# Patient Record
Sex: Female | Born: 1990 | Race: Black or African American | Hispanic: No | Marital: Single | Smoking: Current every day smoker
Health system: Southern US, Community
[De-identification: ages and names within clinical notes are randomized; demographics above are authoritative.]

---

## 2014-06-13 ENCOUNTER — Emergency Department (HOSPITAL_COMMUNITY): Payer: BC Managed Care – PPO

## 2014-06-13 ENCOUNTER — Encounter (HOSPITAL_COMMUNITY): Payer: Self-pay | Admitting: Emergency Medicine

## 2014-06-13 ENCOUNTER — Emergency Department (HOSPITAL_COMMUNITY)
Admission: EM | Admit: 2014-06-13 | Discharge: 2014-06-14 | Disposition: A | Discharge status: Home / Self Care | Payer: BC Managed Care – PPO | Attending: Emergency Medicine | Admitting: Emergency Medicine

## 2014-06-13 DIAGNOSIS — S0993XA Unspecified injury of face, initial encounter: Secondary | ICD-10-CM | POA: Diagnosis not present

## 2014-06-13 DIAGNOSIS — F17200 Nicotine dependence, unspecified, uncomplicated: Secondary | ICD-10-CM | POA: Diagnosis not present

## 2014-06-13 DIAGNOSIS — Y9289 Other specified places as the place of occurrence of the external cause: Secondary | ICD-10-CM | POA: Diagnosis not present

## 2014-06-13 DIAGNOSIS — S060X0A Concussion without loss of consciousness, initial encounter: Secondary | ICD-10-CM | POA: Diagnosis not present

## 2014-06-13 DIAGNOSIS — Y9389 Activity, other specified: Secondary | ICD-10-CM | POA: Diagnosis not present

## 2014-06-13 DIAGNOSIS — S199XXA Unspecified injury of neck, initial encounter: Secondary | ICD-10-CM

## 2014-06-13 DIAGNOSIS — IMO0002 Reserved for concepts with insufficient information to code with codable children: Secondary | ICD-10-CM | POA: Insufficient documentation

## 2014-06-13 DIAGNOSIS — R42 Dizziness and giddiness: Secondary | ICD-10-CM | POA: Insufficient documentation

## 2014-06-13 DIAGNOSIS — S0990XA Unspecified injury of head, initial encounter: Secondary | ICD-10-CM | POA: Diagnosis present

## 2014-06-13 MED ORDER — HYDROCODONE-ACETAMINOPHEN 5-325 MG PO TABS
2.0000 | ORAL_TABLET | Freq: Once | ORAL | Status: AC
  Administered 2014-06-13: 2 via ORAL
  Filled 2014-06-13: qty 2

## 2014-06-13 MED ORDER — ONDANSETRON 8 MG PO TBDP
8.0000 mg | ORAL_TABLET | Freq: Once | ORAL | Status: AC
  Administered 2014-06-13: 8 mg via ORAL
  Filled 2014-06-13: qty 1
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
## 2014-06-13 NOTE — ED Notes (Signed)
Patient transported to CT 
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
## 2014-06-13 NOTE — Discharge Instructions (Signed)
Concussion  A concussion, or closed-head injury, is a brain injury caused by a direct blow to the head or by a quick and sudden movement (jolt) of the head or neck. Concussions are usually not life-threatening. Even so, the effects of a concussion can be serious. If you have had a concussion before, you are more likely to experience concussion-like symptoms after a direct blow to the head.   CAUSES  · Direct blow to the head, such as from running into another player during a soccer game, being hit in a fight, or hitting your head on a hard surface.  · A jolt of the head or neck that causes the brain to move back and forth inside the skull, such as in a car crash.  SIGNS AND SYMPTOMS  The signs of a concussion can be hard to notice. Early on, they may be missed by you, family members, and health care providers. You may look fine but act or feel differently.  Symptoms are usually temporary, but they may last for days, weeks, or even longer. Some symptoms may appear right away while others may not show up for hours or days. Every head injury is different. Symptoms include:  · Mild to moderate headaches that will not go away.  · A feeling of pressure inside your head.  · Having more trouble than usual:  ¨ Learning or remembering things you have heard.  ¨ Answering questions.  ¨ Paying attention or concentrating.  ¨ Organizing daily tasks.  ¨ Making decisions and solving problems.  · Slowness in thinking, acting or reacting, speaking, or reading.  · Getting lost or being easily confused.  · Feeling tired all the time or lacking energy (fatigued).  · Feeling drowsy.  · Sleep disturbances.  ¨ Sleeping more than usual.  ¨ Sleeping less than usual.  ¨ Trouble falling asleep.  ¨ Trouble sleeping (insomnia).  · Loss of balance or feeling lightheaded or dizzy.  · Nausea or vomiting.  · Numbness or tingling.  · Increased sensitivity to:  ¨ Sounds.  ¨ Lights.  ¨ Distractions.  · Vision problems or eyes that tire  easily.  · Diminished sense of taste or smell.  · Ringing in the ears.  · Mood changes such as feeling sad or anxious.  · Becoming easily irritated or angry for little or no reason.  · Lack of motivation.  · Seeing or hearing things other people do not see or hear (hallucinations).  DIAGNOSIS  Your health care provider can usually diagnose a concussion based on a description of your injury and symptoms. He or she will ask whether you passed out (lost consciousness) and whether you are having trouble remembering events that happened right before and during your injury.  Your evaluation might include:  · A brain scan to look for signs of injury to the brain. Even if the test shows no injury, you may still have a concussion.  · Blood tests to be sure other problems are not present.  TREATMENT  · Concussions are usually treated in an emergency department, in urgent care, or at a clinic. You may need to stay in the hospital overnight for further treatment.  · Tell your health care provider if you are taking any medicines, including prescription medicines, over-the-counter medicines, and natural remedies. Some medicines, such as blood thinners (anticoagulants) and aspirin, may increase the chance of complications. Also tell your health care provider whether you have had alcohol or are taking illegal drugs. This information   may affect treatment.  · Your health care provider will send you home with important instructions to follow.  · How fast you will recover from a concussion depends on many factors. These factors include how severe your concussion is, what part of your brain was injured, your age, and how healthy you were before the concussion.  · Most people with mild injuries recover fully. Recovery can take time. In general, recovery is slower in older persons. Also, persons who have had a concussion in the past or have other medical problems may find that it takes longer to recover from their current injury.  HOME  CARE INSTRUCTIONS  General Instructions  · Carefully follow the directions your health care provider gave you.  · Only take over-the-counter or prescription medicines for pain, discomfort, or fever as directed by your health care provider.  · Take only those medicines that your health care provider has approved.  · Do not drink alcohol until your health care provider says you are well enough to do so. Alcohol and certain other drugs may slow your recovery and can put you at risk of further injury.  · If it is harder than usual to remember things, write them down.  · If you are easily distracted, try to do one thing at a time. For example, do not try to watch TV while fixing dinner.  · Talk with family members or close friends when making important decisions.  · Keep all follow-up appointments. Repeated evaluation of your symptoms is recommended for your recovery.  · Watch your symptoms and tell others to do the same. Complications sometimes occur after a concussion. Older adults with a brain injury may have a higher risk of serious complications, such as a blood clot on the brain.  · Tell your teachers, school nurse, school counselor, coach, athletic trainer, or work manager about your injury, symptoms, and restrictions. Tell them about what you can or cannot do. They should watch for:  ¨ Increased problems with attention or concentration.  ¨ Increased difficulty remembering or learning new information.  ¨ Increased time needed to complete tasks or assignments.  ¨ Increased irritability or decreased ability to cope with stress.  ¨ Increased symptoms.  · Rest. Rest helps the brain to heal. Make sure you:  ¨ Get plenty of sleep at night. Avoid staying up late at night.  ¨ Keep the same bedtime hours on weekends and weekdays.  ¨ Rest during the day. Take daytime naps or rest breaks when you feel tired.  · Limit activities that require a lot of thought or concentration. These include:  ¨ Doing homework or job-related  work.  ¨ Watching TV.  ¨ Working on the computer.  · Avoid any situation where there is potential for another head injury (football, hockey, soccer, basketball, martial arts, downhill snow sports and horseback riding). Your condition will get worse every time you experience a concussion. You should avoid these activities until you are evaluated by the appropriate follow-up health care providers.  Returning To Your Regular Activities  You will need to return to your normal activities slowly, not all at once. You must give your body and brain enough time for recovery.  · Do not return to sports or other athletic activities until your health care provider tells you it is safe to do so.  · Ask your health care provider when you can drive, ride a bicycle, or operate heavy machinery. Your ability to react may be slower after a   brain injury. Never do these activities if you are dizzy.  · Ask your health care provider about when you can return to work or school.  Preventing Another Concussion  It is very important to avoid another brain injury, especially before you have recovered. In rare cases, another injury can lead to permanent brain damage, brain swelling, or death. The risk of this is greatest during the first 7-10 days after a head injury. Avoid injuries by:  · Wearing a seat belt when riding in a car.  · Drinking alcohol only in moderation.  · Wearing a helmet when biking, skiing, skateboarding, skating, or doing similar activities.  · Avoiding activities that could lead to a second concussion, such as contact or recreational sports, until your health care provider says it is okay.  · Taking safety measures in your home.  ¨ Remove clutter and tripping hazards from floors and stairways.  ¨ Use grab bars in bathrooms and handrails by stairs.  ¨ Place non-slip mats on floors and in bathtubs.  ¨ Improve lighting in dim areas.  SEEK MEDICAL CARE IF:  · You have increased problems paying attention or  concentrating.  · You have increased difficulty remembering or learning new information.  · You need more time to complete tasks or assignments than before.  · You have increased irritability or decreased ability to cope with stress.  · You have more symptoms than before.  Seek medical care if you have any of the following symptoms for more than 2 weeks after your injury:  · Lasting (chronic) headaches.  · Dizziness or balance problems.  · Nausea.  · Vision problems.  · Increased sensitivity to noise or light.  · Depression or mood swings.  · Anxiety or irritability.  · Memory problems.  · Difficulty concentrating or paying attention.  · Sleep problems.  · Feeling tired all the time.  SEEK IMMEDIATE MEDICAL CARE IF:  · You have severe or worsening headaches. These may be a sign of a blood clot in the brain.  · You have weakness (even if only in one hand, leg, or part of the face).  · You have numbness.  · You have decreased coordination.  · You vomit repeatedly.  · You have increased sleepiness.  · One pupil is larger than the other.  · You have convulsions.  · You have slurred speech.  · You have increased confusion. This may be a sign of a blood clot in the brain.  · You have increased restlessness, agitation, or irritability.  · You are unable to recognize people or places.  · You have neck pain.  · It is difficult to wake you up.  · You have unusual behavior changes.  · You lose consciousness.  MAKE SURE YOU:  · Understand these instructions.  · Will watch your condition.  · Will get help right away if you are not doing well or get worse.  Document Released: 12/19/2003 Document Revised: 10/03/2013 Document Reviewed: 04/20/2013  [employer]® Patient Information ©2015 [employer]. This information is not intended to replace advice given to you by your health care provider. Make sure you discuss any questions you have with your health care provider.

## 2014-06-14 MED ORDER — ONDANSETRON HCL 4 MG PO TABS: 4.0000 mg | ORAL_TABLET | Freq: Four times a day (QID) | ORAL | Status: DC

## 2014-06-14 MED ORDER — HYDROCODONE-ACETAMINOPHEN 5-325 MG PO TABS
1.0000 | ORAL_TABLET | Freq: Four times a day (QID) | ORAL | Status: DC | PRN
Start: 2014-06-14 — End: 2014-08-07

## 2014-06-14 NOTE — ED Provider Notes (Signed)
Medical screening examination/treatment/procedure(s) were performed by non-physician practitioner and as supervising physician I was immediately available for consultation/collaboration.   EKG Interpretation None       Iesha Summerhill M Devyn Griffing, MD 06/14/14 0658 

## 2014-08-07 ENCOUNTER — Emergency Department (HOSPITAL_COMMUNITY)
Admission: EM | Admit: 2014-08-07 | Discharge: 2014-08-08 | Disposition: A | Discharge status: Home / Self Care | Payer: BC Managed Care – PPO | Attending: Emergency Medicine | Admitting: Emergency Medicine

## 2014-08-07 ENCOUNTER — Emergency Department (HOSPITAL_COMMUNITY): Payer: BC Managed Care – PPO

## 2014-08-07 ENCOUNTER — Encounter (HOSPITAL_COMMUNITY): Payer: Self-pay | Admitting: Emergency Medicine

## 2014-08-07 DIAGNOSIS — Z720 Tobacco use: Secondary | ICD-10-CM | POA: Insufficient documentation

## 2014-08-07 DIAGNOSIS — R51 Headache: Secondary | ICD-10-CM | POA: Diagnosis present

## 2014-08-07 DIAGNOSIS — Z3202 Encounter for pregnancy test, result negative: Secondary | ICD-10-CM | POA: Diagnosis not present

## 2014-08-07 DIAGNOSIS — J111 Influenza due to unidentified influenza virus with other respiratory manifestations: Secondary | ICD-10-CM

## 2014-08-07 DIAGNOSIS — R05 Cough: Secondary | ICD-10-CM

## 2014-08-07 DIAGNOSIS — R059 Cough, unspecified: Secondary | ICD-10-CM

## 2014-08-07 DIAGNOSIS — R69 Illness, unspecified: Secondary | ICD-10-CM

## 2014-08-07 DIAGNOSIS — R509 Fever, unspecified: Secondary | ICD-10-CM

## 2014-08-07 LAB — URINALYSIS, ROUTINE W REFLEX MICROSCOPIC
Bilirubin Urine: NEGATIVE
Glucose, UA: NEGATIVE mg/dL
Hgb urine dipstick: NEGATIVE
Ketones, ur: 40 mg/dL — AB
Nitrite: NEGATIVE
Protein, ur: 30 mg/dL — AB
Specific Gravity, Urine: 1.03 (ref 1.005–1.030)
Urobilinogen, UA: 0.2 mg/dL (ref 0.0–1.0)
pH: 5 (ref 5.0–8.0)

## 2014-08-07 LAB — BASIC METABOLIC PANEL
Anion gap: 15 (ref 5–15)
BUN: 9 mg/dL (ref 6–23)
CO2: 23 mEq/L (ref 19–32)
Calcium: 9.5 mg/dL (ref 8.4–10.5)
Chloride: 100 mEq/L (ref 96–112)
Creatinine, Ser: 1.02 mg/dL (ref 0.50–1.10)
GFR calc Af Amer: >90 mL/min (ref >90)
GFR calc non Af Amer: 78 mL/min — ABNORMAL LOW (ref >90)
Glucose, Bld: 94 mg/dL (ref 70–99)
Potassium: 3.8 mEq/L (ref 3.7–5.3)
Sodium: 138 mEq/L (ref 137–147)

## 2014-08-07 LAB — CBC WITH DIFFERENTIAL/PLATELET
Basophils Absolute: 0 10*3/uL (ref 0.0–0.1)
Basophils Relative: 0 % (ref 0–1)
Eosinophils Absolute: 0 10*3/uL (ref 0.0–0.7)
Eosinophils Relative: 0 % (ref 0–5)
HCT: 38.6 % (ref 36.0–46.0)
Hemoglobin: 12.8 g/dL (ref 12.0–15.0)
Lymphocytes Relative: 19 % (ref 12–46)
Lymphs Abs: 2.9 10*3/uL (ref 0.7–4.0)
MCH: 29.3 pg (ref 26.0–34.0)
MCHC: 33.2 g/dL (ref 30.0–36.0)
MCV: 88.3 fL (ref 78.0–100.0)
Monocytes Absolute: 1.1 10*3/uL — ABNORMAL HIGH (ref 0.1–1.0)
Monocytes Relative: 7 % (ref 3–12)
Neutro Abs: 11.6 10*3/uL — ABNORMAL HIGH (ref 1.7–7.7)
Neutrophils Relative %: 74 % (ref 43–77)
Platelets: 356 10*3/uL (ref 150–400)
RBC: 4.37 MIL/uL (ref 3.87–5.11)
RDW: 13.5 % (ref 11.5–15.5)
WBC: 15.8 10*3/uL — ABNORMAL HIGH (ref 4.0–10.5)

## 2014-08-07 LAB — PREGNANCY, URINE: Preg Test, Ur: NEGATIVE

## 2014-08-07 LAB — URINE MICROSCOPIC-ADD ON

## 2014-08-07 LAB — RAPID STREP SCREEN (MED CTR MEBANE ONLY): Streptococcus, Group A Screen (Direct): NEGATIVE

## 2014-08-07 MED ORDER — SODIUM CHLORIDE 0.9 % IV BOLUS (SEPSIS)
2000.0000 mL | Freq: Once | INTRAVENOUS | Status: AC
  Administered 2014-08-07: 2000 mL via INTRAVENOUS

## 2014-08-07 MED ORDER — KETOROLAC TROMETHAMINE 30 MG/ML IJ SOLN
30.0000 mg | Freq: Once | INTRAMUSCULAR | Status: AC
  Administered 2014-08-07: 30 mg via INTRAVENOUS
  Filled 2014-08-07: qty 1

## 2014-08-07 NOTE — ED Notes (Signed)
Pt states headache with nausea/vomiting since Thursday.  Generalized body aches.

## 2014-08-07 NOTE — ED Provider Notes (Signed)
CSN: [id number]     Arrival date & time 08/07/14  1629 History   First MD Initiated Contact with Patient 08/07/14 2000     Chief Complaint  Patient presents with  . Headache  . Emesis     (Consider location/radiation/quality/duration/timing/severity/associated sxs/prior Treatment) HPI Patient presents to the emergency department with headache, nausea, vomiting, sore throat, cough, runny nose, body aches for the last 4 days.  Patient states that she felt like she may be getting better on Sunday, but continued to have worsening symptoms.  The patient states that she tried NyQuil and Tylenol without significant relief of her symptoms.  Patient denies chest pain, shortness of breath, blurred vision, dizziness, lightheadedness, anorexia, photophobia, dysuria, near syncope or syncope.  The patient states that shenotes that movement makes her body aches, worse, nothing seems to make her condition better History reviewed. No pertinent past medical history. History reviewed. No pertinent past surgical history. History reviewed. No pertinent family history. History  Substance Use Topics  . Smoking status: Current Every Day Smoker -- 0.01 packs/day    Types: Cigarettes  . Smokeless tobacco: Not on file  . Alcohol Use: Yes     Comment: socially   OB History   Grav Para Term Preterm Abortions TAB SAB Ect Mult Living                 Review of Systems  All other systems negative except as documented in the HPI. All pertinent positives and negatives as reviewed in the HPI.   Allergies  Review of patient's allergies indicates no known allergies.  Home Medications   Prior to Admission medications   Medication Sig Start Date End Date Taking? Authorizing Provider  acetaminophen (TYLENOL) 500 MG tablet Take 1,000 mg by mouth daily as needed for fever (fever & aching).    Yes Historical Provider, MD  HYDROcodone-acetaminophen (NORCO/VICODIN) 5-325 MG per tablet Take 1 tablet by mouth every 6  (six) hours as needed for moderate pain (pain).   Yes Historical Provider, MD  Pseudoeph-Doxylamine-DM-APAP (NYQUIL PO) Take 2 tablets by mouth daily as needed (fever).   Yes Historical Provider, MD  pseudoephedrine-acetaminophen (TYLENOL SINUS) 30-500 MG TABS Take 1 tablet by mouth every 4 (four) hours as needed (fever).   Yes Historical Provider, MD   BP 127/72  Pulse 92  Temp(Src) 100.3 F (37.9 C) (Oral)  Resp 18  SpO2 100%  LMP 07/31/2014 Physical Exam  Nursing note and vitals reviewed. Constitutional: She is oriented to person, place, and time. She appears well-developed and well-nourished. No distress.  HENT:  Head: Normocephalic and atraumatic.  Mouth/Throat: Oropharynx is clear and moist.  Eyes: Pupils are equal, round, and reactive to light.  Neck: Trachea normal and normal range of motion. Neck supple. Muscular tenderness present. No spinous process tenderness present. No rigidity. No edema, no erythema and normal range of motion present.  Cardiovascular: Normal rate, regular rhythm and normal heart sounds.  Exam reveals no gallop and no friction rub.   No murmur heard. Pulmonary/Chest: Effort normal and breath sounds normal. No respiratory distress.  Abdominal: Soft. Bowel sounds are normal. She exhibits no distension. There is no tenderness.  Musculoskeletal: She exhibits no edema.  Neurological: She is alert and oriented to person, place, and time. She exhibits normal muscle tone. Coordination normal.  Skin: Skin is warm and dry. No rash noted. No erythema.    ED Course  Procedures (including critical care time) Labs Review Labs Reviewed  CBC WITH DIFFERENTIAL -  Abnormal; Notable for the following:    WBC 15.8 (*)    Neutro Abs 11.6 (*)    Monocytes Absolute 1.1 (*)    All other components within normal limits  URINALYSIS, ROUTINE W REFLEX MICROSCOPIC - Abnormal; Notable for the following:    Color, Urine AMBER (*)    APPearance TURBID (*)    Ketones, ur 40 (*)     Protein, ur 30 (*)    Leukocytes, UA MODERATE (*)    All other components within normal limits  URINE MICROSCOPIC-ADD ON - Abnormal; Notable for the following:    Squamous Epithelial / LPF MANY (*)    Bacteria, UA MANY (*)    All other components within normal limits  RAPID STREP SCREEN  URINE CULTURE  PREGNANCY, URINE  BASIC METABOLIC PANEL    Imaging Review Dg Chest 2 View  08/07/2014   CLINICAL DATA:  Fever and cough.  EXAM: CHEST  2 VIEW  COMPARISON:  None.  FINDINGS: Normal heart size and mediastinal contours. No acute infiltrate or edema. No effusion or pneumothorax. No acute osseous findings.  IMPRESSION: Negative for pneumonia   Electronically Signed   By: Tiburcio PeaJonathan  Watts M.D.   On: 08/07/2014 20:56    Patient will be treated for a flulike illness.  She is advised to return here as needed.  Told to follow up with her primary care doctor. advised her to rest, increase her fluid intake.  Patient has no nuchal rigidity and will wait for urine culture before considering treatment for her urine  MDM   Final diagnoses:  Fever  Cough         Carlyle Dollyhristopher W Shavontae Gibeault, PA-C 08/08/14 0008

## 2014-08-08 MED ORDER — IBUPROFEN 800 MG PO TABS: 800.0000 mg | ORAL_TABLET | Freq: Three times a day (TID) | ORAL | Status: AC | PRN

## 2014-08-08 MED ORDER — PROMETHAZINE-DM 6.25-15 MG/5ML PO SYRP: 5.0000 mL | ORAL_SOLUTION | Freq: Four times a day (QID) | ORAL | Status: AC | PRN

## 2014-08-08 MED ORDER — GUAIFENESIN ER 1200 MG PO TB12: 1.0000 | ORAL_TABLET | Freq: Two times a day (BID) | ORAL | Status: AC

## 2014-08-08 NOTE — ED Provider Notes (Signed)
  Medical screening examination/treatment/procedure(s) were performed by non-physician practitioner and as supervising physician I was immediately available for consultation/collaboration.   EKG Interpretation None         Gerhard Munchobert Bahja Bence, MD 08/08/14 [phone]

## 2014-08-08 NOTE — Discharge Instructions (Signed)
Return here as needed.  Rest as much as possible, increase your fluid intake.  Your chest x-ray did not show any signs of pneumonia and your strep test was negative.  This is most likely a flulike illness

## 2014-08-09 LAB — URINE CULTURE: Colony Count: >=100000

## 2014-08-09 LAB — CULTURE, GROUP A STREP

## 2015-01-07 ENCOUNTER — Emergency Department (HOSPITAL_COMMUNITY): Payer: BC Managed Care – PPO

## 2015-01-07 ENCOUNTER — Emergency Department (HOSPITAL_COMMUNITY)
Admission: EM | Admit: 2015-01-07 | Discharge: 2015-01-07 | Disposition: A | Discharge status: Home / Self Care | Payer: BC Managed Care – PPO | Attending: Emergency Medicine | Admitting: Emergency Medicine

## 2015-01-07 ENCOUNTER — Encounter (HOSPITAL_COMMUNITY): Payer: Self-pay | Admitting: Neurology

## 2015-01-07 DIAGNOSIS — S299XXA Unspecified injury of thorax, initial encounter: Secondary | ICD-10-CM | POA: Diagnosis not present

## 2015-01-07 DIAGNOSIS — Y9389 Activity, other specified: Secondary | ICD-10-CM | POA: Diagnosis not present

## 2015-01-07 DIAGNOSIS — Z3202 Encounter for pregnancy test, result negative: Secondary | ICD-10-CM | POA: Diagnosis not present

## 2015-01-07 DIAGNOSIS — S3991XA Unspecified injury of abdomen, initial encounter: Secondary | ICD-10-CM | POA: Insufficient documentation

## 2015-01-07 DIAGNOSIS — S76011A Strain of muscle, fascia and tendon of right hip, initial encounter: Secondary | ICD-10-CM

## 2015-01-07 DIAGNOSIS — Z720 Tobacco use: Secondary | ICD-10-CM | POA: Insufficient documentation

## 2015-01-07 DIAGNOSIS — S3992XA Unspecified injury of lower back, initial encounter: Secondary | ICD-10-CM | POA: Diagnosis not present

## 2015-01-07 DIAGNOSIS — Z79899 Other long term (current) drug therapy: Secondary | ICD-10-CM | POA: Insufficient documentation

## 2015-01-07 DIAGNOSIS — Y92410 Unspecified street and highway as the place of occurrence of the external cause: Secondary | ICD-10-CM | POA: Diagnosis not present

## 2015-01-07 DIAGNOSIS — Y998 Other external cause status: Secondary | ICD-10-CM | POA: Diagnosis not present

## 2015-01-07 DIAGNOSIS — S79911A Unspecified injury of right hip, initial encounter: Secondary | ICD-10-CM | POA: Diagnosis present

## 2015-01-07 DIAGNOSIS — R52 Pain, unspecified: Secondary | ICD-10-CM

## 2015-01-07 DIAGNOSIS — S76019A Strain of muscle, fascia and tendon of unspecified hip, initial encounter: Secondary | ICD-10-CM | POA: Insufficient documentation

## 2015-01-07 LAB — URINALYSIS, ROUTINE W REFLEX MICROSCOPIC
BILIRUBIN URINE: NEGATIVE
GLUCOSE, UA: NEGATIVE mg/dL
HGB URINE DIPSTICK: NEGATIVE
Ketones, ur: NEGATIVE mg/dL
Nitrite: NEGATIVE
Protein, ur: NEGATIVE mg/dL
SPECIFIC GRAVITY, URINE: 1.016 (ref 1.005–1.030)
UROBILINOGEN UA: 0.2 mg/dL (ref 0.0–1.0)
pH: 7 (ref 5.0–8.0)

## 2015-01-07 LAB — I-STAT CHEM 8, ED
BUN: 12 mg/dL (ref 6–23)
Calcium, Ion: 1.23 mmol/L (ref 1.12–1.23)
Chloride: 102 mmol/L (ref 96–112)
Creatinine, Ser: 1 mg/dL (ref 0.50–1.10)
Glucose, Bld: 86 mg/dL (ref 70–99)
HCT: 39 % (ref 36.0–46.0)
Hemoglobin: 13.3 g/dL (ref 12.0–15.0)
Potassium: 4.1 mmol/L (ref 3.5–5.1)
SODIUM: 140 mmol/L (ref 135–145)
TCO2: 23 mmol/L (ref 0–100)

## 2015-01-07 LAB — URINE MICROSCOPIC-ADD ON

## 2015-01-07 LAB — POC URINE PREG, ED: PREG TEST UR: NEGATIVE

## 2015-01-07 MED ORDER — OXYCODONE-ACETAMINOPHEN 5-325 MG PO TABS
1.0000 | ORAL_TABLET | Freq: Once | ORAL | Status: AC
  Administered 2015-01-07: 1 via ORAL
  Filled 2015-01-07: qty 1

## 2015-01-07 MED ORDER — OXYCODONE-ACETAMINOPHEN 5-325 MG PO TABS: 1.0000 | ORAL_TABLET | ORAL | Status: AC | PRN

## 2015-01-07 MED ORDER — CYCLOBENZAPRINE HCL 10 MG PO TABS: 10.0000 mg | ORAL_TABLET | Freq: Three times a day (TID) | ORAL | Status: DC | PRN

## 2015-01-07 NOTE — ED Notes (Signed)
Pt remains in xray.
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
## 2015-01-07 NOTE — ED Notes (Signed)
Patient transported to X-ray without
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
## 2015-04-29 ENCOUNTER — Emergency Department (HOSPITAL_COMMUNITY)
Admission: EM | Admit: 2015-04-29 | Discharge: 2015-04-29 | Disposition: A | Discharge status: Home / Self Care | Payer: BC Managed Care – PPO | Attending: Emergency Medicine | Admitting: Emergency Medicine

## 2015-04-29 ENCOUNTER — Emergency Department (HOSPITAL_COMMUNITY): Payer: BC Managed Care – PPO

## 2015-04-29 ENCOUNTER — Encounter (HOSPITAL_COMMUNITY): Payer: Self-pay | Admitting: Emergency Medicine

## 2015-04-29 DIAGNOSIS — Z8781 Personal history of (healed) traumatic fracture: Secondary | ICD-10-CM | POA: Insufficient documentation

## 2015-04-29 DIAGNOSIS — Z720 Tobacco use: Secondary | ICD-10-CM | POA: Insufficient documentation

## 2015-04-29 DIAGNOSIS — Z3202 Encounter for pregnancy test, result negative: Secondary | ICD-10-CM | POA: Diagnosis not present

## 2015-04-29 DIAGNOSIS — M25551 Pain in right hip: Secondary | ICD-10-CM | POA: Insufficient documentation

## 2015-04-29 LAB — POC URINE PREG, ED: Preg Test, Ur: NEGATIVE

## 2015-04-29 MED ORDER — NAPROXEN 500 MG PO TABS: 500.0000 mg | ORAL_TABLET | Freq: Two times a day (BID) | ORAL | Status: AC

## 2015-04-29 MED ORDER — CYCLOBENZAPRINE HCL 10 MG PO TABS: 5.0000 mg | ORAL_TABLET | Freq: Two times a day (BID) | ORAL | Status: DC | PRN

## 2015-04-29 MED ORDER — TRAMADOL HCL 50 MG PO TABS: 50.0000 mg | ORAL_TABLET | Freq: Four times a day (QID) | ORAL | Status: AC | PRN

## 2015-04-29 NOTE — Discharge Instructions (Signed)
Hip Pain Your hip is the joint between your upper legs and your lower pelvis. The bones, cartilage, tendons, and muscles of your hip joint perform a lot of work each day supporting your body weight and allowing you to move around. Hip pain can range from a minor ache to severe pain in one or both of your hips. Pain may be felt on the inside of the hip joint near the groin, or the outside near the buttocks and upper thigh. You may have swelling or stiffness as well.  HOME CARE INSTRUCTIONS   Take medicines only as directed by your health care provider.  Apply ice to the injured area:  Put ice in a plastic bag.  Place a towel between your skin and the bag.  Leave the ice on for 15-20 minutes at a time, 3-4 times a day.  Keep your leg raised (elevated) when possible to lessen swelling.  Avoid activities that cause pain.  Follow specific exercises as directed by your health care provider.  Sleep with a pillow between your legs on your most comfortable side.  Record how often you have hip pain, the location of the pain, and what it feels like. SEEK MEDICAL CARE IF:   You are unable to put weight on your leg.  Your hip is red or swollen or very tender to touch.  Your pain or swelling continues or worsens after 1 week.  You have increasing difficulty walking.  You have a fever. SEEK IMMEDIATE MEDICAL CARE IF:   You have fallen.  You have a sudden increase in pain and swelling in your hip. MAKE SURE YOU:   Understand these instructions.  Will watch your condition.  Will get help right away if you are not doing well or get worse. Document Released: 03/18/2010 Document Revised: 02/12/2014 Document Reviewed: 05/25/2013 [employer] Patient Information 2015 [employer]. This information is not intended to replace advice given to you by your health care provider. Make sure you discuss any questions you have with your health care provider.  
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
## 2016-03-26 IMAGING — CT CT HEAD W/O CM
4 series · 16 of 30 positions shown, 18 images · non-contrast
Comparison: None.

CLINICAL DATA: severe Headache severe Headache; HEAD INJURY HEAD
INJURY

EXAM:
CT HEAD WITHOUT CONTRAST
CT CERVICAL SPINE WITHOUT CONTRAST
TECHNIQUE: Multidetector CT imaging of the head and cervical spine was
performed following the standard protocol without intravenous
contrast. Multiplanar CT image reconstructions of the cervical spine
were also generated.

[Series 3: head w/o · axial · non-contrast · 0.44mm/px · z∈[-129,-79]mm · 2 of 30 slices shown]
[im 10/30  brain]
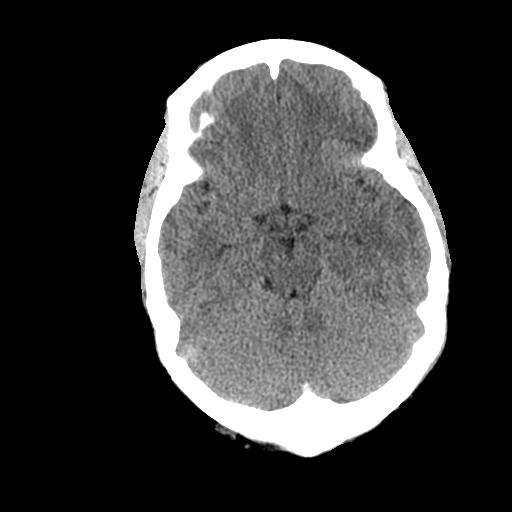
[im 20/30  brain]
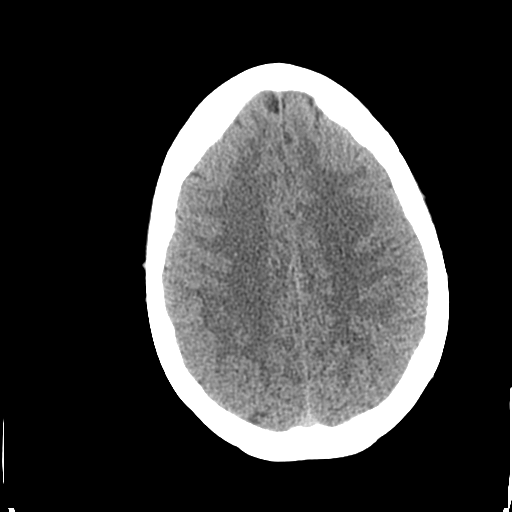

[Series 4: bone windows · axial · 0.44mm/px · z∈[-147,-60]mm · 4 of 49 slices shown]
[im 10/49  bone]
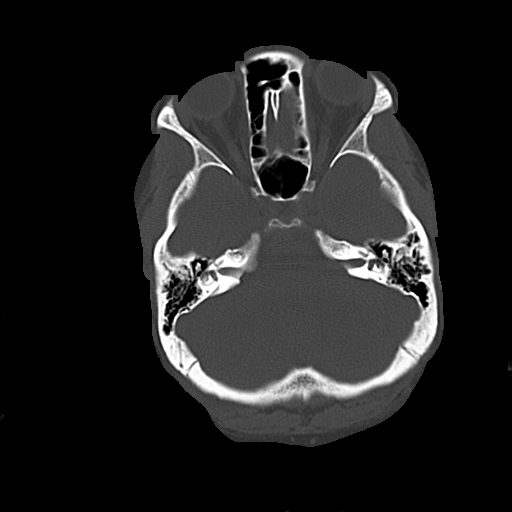
[im 20/49  bone]
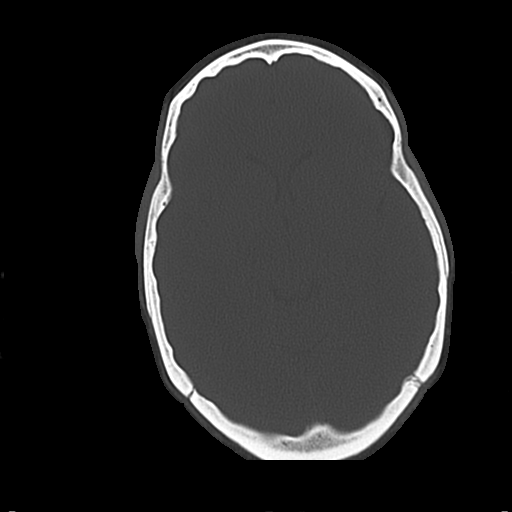
[im 29/49  bone]
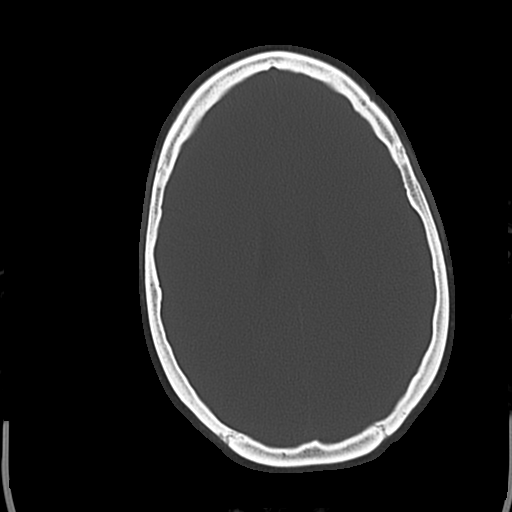
[im 39/49  bone]
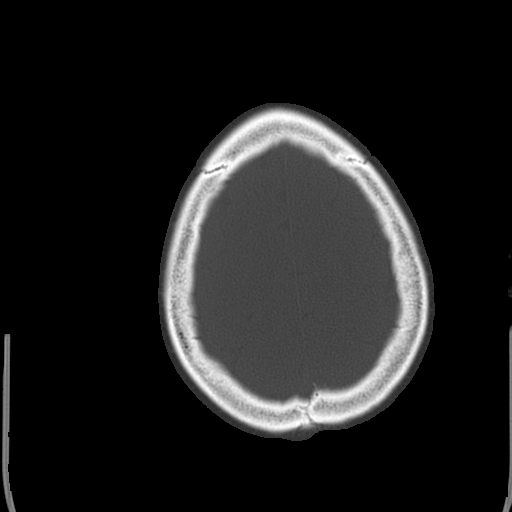

[Series 5: c-spine st · axial · 0.24mm/px · z∈[-294,-274]mm · 2 of 89 slices shown]
[im 10/89  brain]
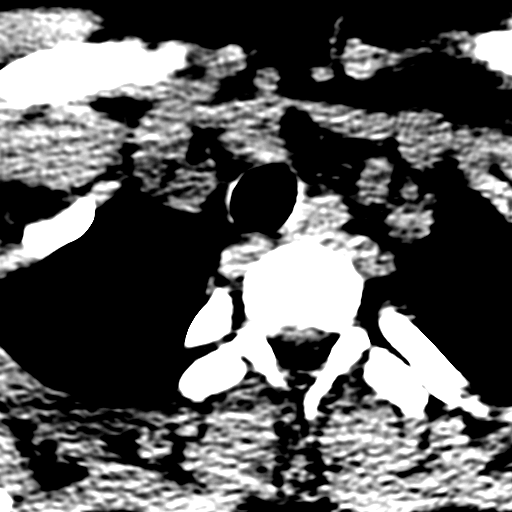
[im 20/89  brain]
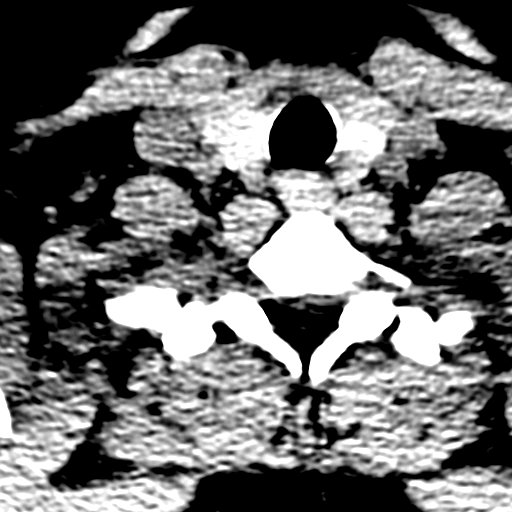

[Series 8: axial reformats · axial · 0.22mm/px · z∈[-300,-169]mm · 8 of 89 slices shown, 10 images]
[im 10/89  brain]
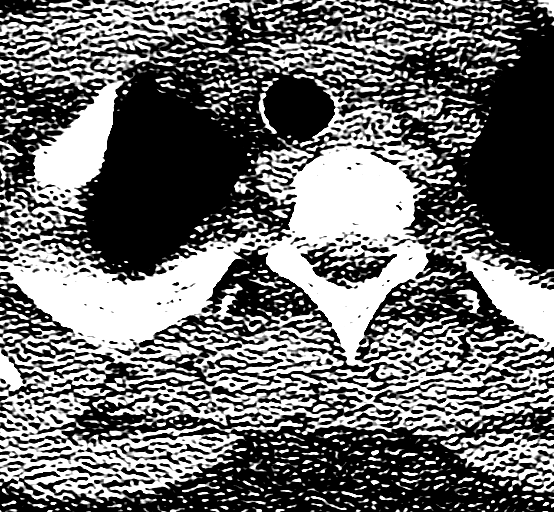
[im 10/89  bone]
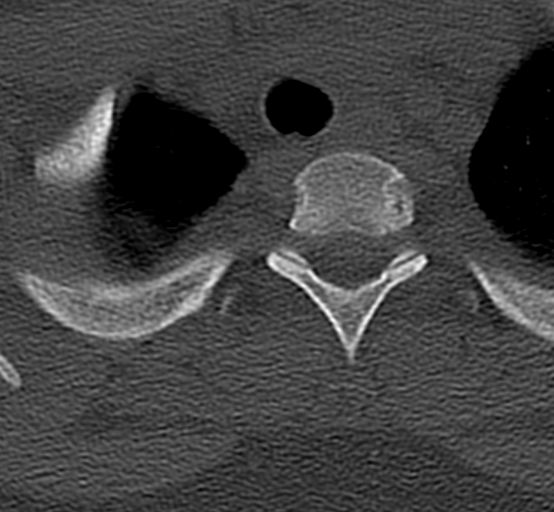
[im 20/89  brain]
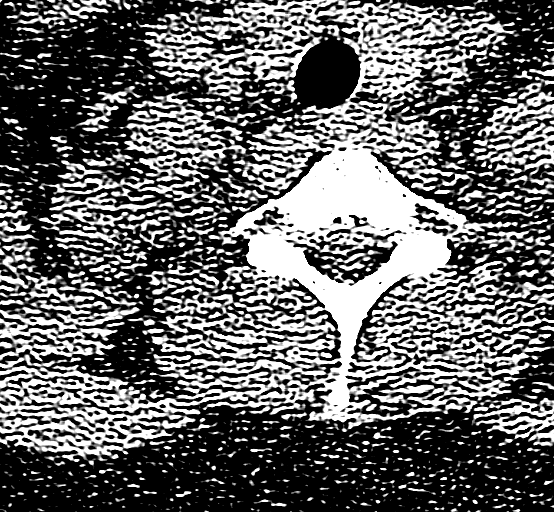
[im 30/89  brain]
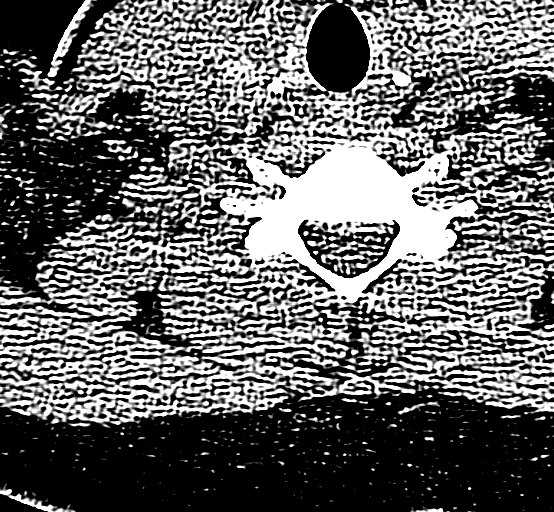
[im 40/89  brain]
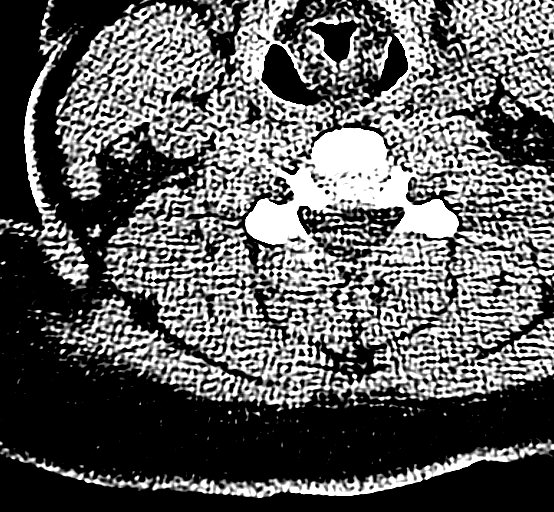
[im 49/89  brain]
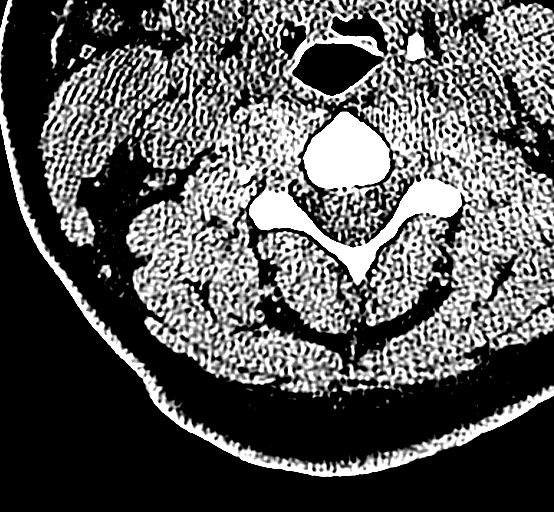
[im 49/89  bone]
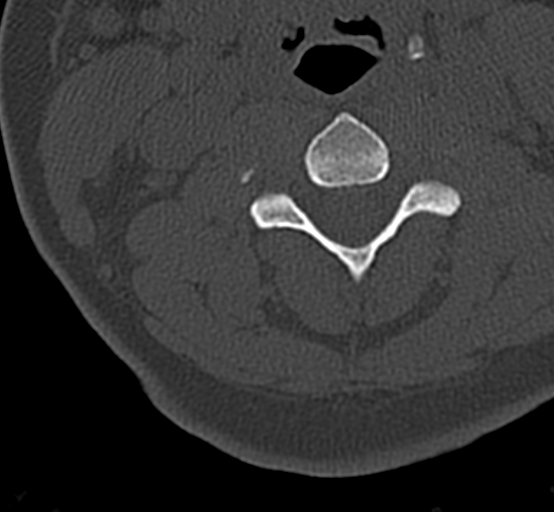
[im 59/89  brain]
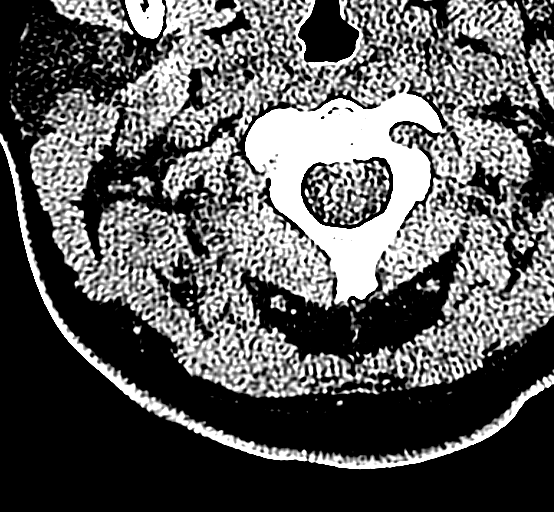
[im 69/89  brain]
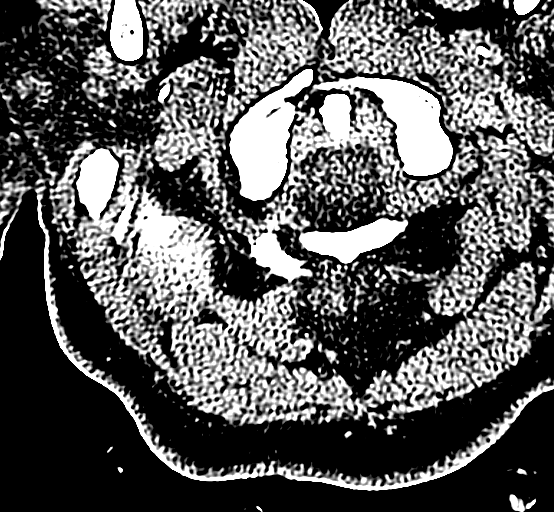
[im 79/89  brain]
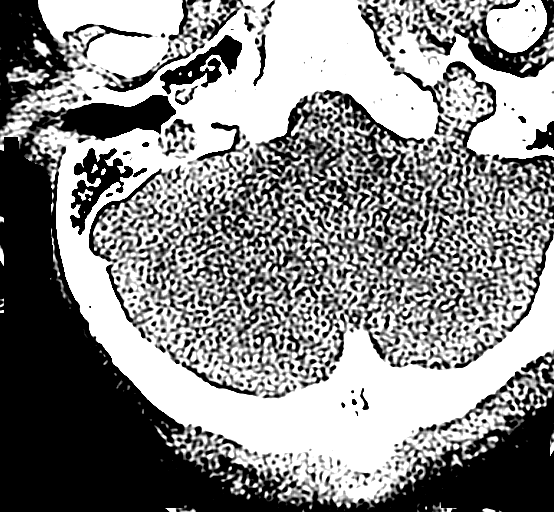

[16 of 30 positions shown; findings below may reference images not displayed]

FINDINGS: CT HEAD FINDINGS

There is no evidence of acute intracranial hemorrhage, brain edema,
mass lesion, acute infarction, mass effect, or midline shift. Acute
infarct may be inapparent on noncontrast CT. No other intra-axial
abnormalities are seen, and the ventricles and sulci are within
normal limits in size and symmetry. No abnormal extra-axial fluid
collections or masses are identified. No significant calvarial
abnormality.

CT CERVICAL SPINE FINDINGS

Reversal of the normal cervical lordosis. Vertebral body and disc
height maintained throughout. No prevertebral soft tissue swelling.
Facets are seated. Negative for fracture. No significant osseous
degenerative change. Visualized lung apices clear.
IMPRESSION: 1. Negative for bleed or other acute intracranial process.
2. No cervical fracture or other acute bone abnormality.
3. Loss of the normal cervical spine lordosis, which may be
secondary to positioning, spasm, or soft tissue injury.

## 2017-08-03 ENCOUNTER — Emergency Department: Payer: No Typology Code available for payment source

## 2017-08-03 ENCOUNTER — Encounter: Payer: Self-pay | Admitting: Emergency Medicine

## 2017-08-03 ENCOUNTER — Emergency Department
Admission: EM | Admit: 2017-08-03 | Discharge: 2017-08-03 | Disposition: A | Discharge status: Home / Self Care | Payer: No Typology Code available for payment source | Attending: Emergency Medicine | Admitting: Emergency Medicine

## 2017-08-03 DIAGNOSIS — Z79899 Other long term (current) drug therapy: Secondary | ICD-10-CM | POA: Diagnosis not present

## 2017-08-03 DIAGNOSIS — Y998 Other external cause status: Secondary | ICD-10-CM | POA: Insufficient documentation

## 2017-08-03 DIAGNOSIS — M545 Low back pain: Secondary | ICD-10-CM | POA: Diagnosis present

## 2017-08-03 DIAGNOSIS — F17210 Nicotine dependence, cigarettes, uncomplicated: Secondary | ICD-10-CM | POA: Insufficient documentation

## 2017-08-03 DIAGNOSIS — Y92410 Unspecified street and highway as the place of occurrence of the external cause: Secondary | ICD-10-CM | POA: Insufficient documentation

## 2017-08-03 DIAGNOSIS — M546 Pain in thoracic spine: Secondary | ICD-10-CM | POA: Diagnosis not present

## 2017-08-03 DIAGNOSIS — Y9389 Activity, other specified: Secondary | ICD-10-CM | POA: Diagnosis not present

## 2017-08-03 MED ORDER — KETOROLAC TROMETHAMINE 30 MG/ML IJ SOLN
30.0000 mg | Freq: Once | INTRAMUSCULAR | Status: AC
  Administered 2017-08-03: 30 mg via INTRAMUSCULAR
  Filled 2017-08-03: qty 1

## 2017-08-03 MED ORDER — CYCLOBENZAPRINE HCL 10 MG PO TABS: 10.0000 mg | ORAL_TABLET | Freq: Three times a day (TID) | ORAL | 0 refills | Status: AC | PRN

## 2017-08-03 MED ORDER — MELOXICAM 15 MG PO TABS: 15.0000 mg | ORAL_TABLET | Freq: Every day | ORAL | 1 refills | Status: AC
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
## 2017-08-03 NOTE — ED Notes (Signed)
Spoke to xray about urine preg. Pt to sign waiver.
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
## 2017-08-03 NOTE — ED Notes (Signed)
Pt just left the ED.
